# Patient Record
Sex: Male | Born: 1960 | Race: Black or African American | Hispanic: Yes | Smoking: Former smoker
Health system: Southern US, Community
[De-identification: ages and names within clinical notes are randomized; demographics above are authoritative.]

## PROBLEM LIST (undated history)

## (undated) DIAGNOSIS — R7303 Prediabetes: Secondary | ICD-10-CM

## (undated) DIAGNOSIS — R209 Unspecified disturbances of skin sensation: Secondary | ICD-10-CM

## (undated) DIAGNOSIS — M549 Dorsalgia, unspecified: Secondary | ICD-10-CM

## (undated) DIAGNOSIS — I959 Hypotension, unspecified: Secondary | ICD-10-CM

## (undated) HISTORY — DX: Unspecified disturbances of skin sensation: R20.9

## (undated) HISTORY — DX: Prediabetes: R73.03

## (undated) HISTORY — DX: Hypotension, unspecified: I95.9

## (undated) HISTORY — DX: Dorsalgia, unspecified: M54.9

## (undated) HISTORY — PX: OTHER SURGICAL HISTORY: SHX169

---

## 2019-01-27 ENCOUNTER — Ambulatory Visit (INDEPENDENT_AMBULATORY_CARE_PROVIDER_SITE_OTHER): Payer: Self-pay

## 2019-02-01 ENCOUNTER — Telehealth (INDEPENDENT_AMBULATORY_CARE_PROVIDER_SITE_OTHER): Payer: Self-pay

## 2019-02-01 LAB — CORONAVIRUS, COVID-19: SARS-CoV-2 RNA (COVID-19) Qualitative NAAT: DETECTED — CR

## 2019-02-01 NOTE — Telephone Encounter (Signed)
COVID-19 Test Results Notification Team    This Patient was notified by another team member. Called to verify patient have a scheduled follow-up.  Patient have questions regarding CDC guidelines. Reviewed the guidelines with him.       RN spoke with patient to provide POSITIVE COVID-19 test result.      After verifying patient name, address and DOB, patient was notified that test results are positive for COVID-19.    Patient was advised to follow the CDC Guidelines for "10 things you can do to manage your COVID-19 symptoms at home."  Reviewed the following CDC Guidelines with the patient:    . Stay home from work, school and public places.  . Get rest and stay hydrated.  . If you have an appointment, call the healthcare provider ahead of time and notify them that you have COVID-19.  . For medical emergencies, call 911 and notify dispatch that you have COVID-19.  . Coverage cough and sneezes  . Wash your hands often--20 seconds or with sanitizer that contains at least 60% alcohol.  . Stay in a specific room and away from other people in your home.  Wear a mask if you are around people.  . Avoid sharing personal items (dishes, towels, bedding).  . Clean all surfaces that are touched often.   . Monitor your symptoms and contact primary care   o Monitor emergency warning signs--persistent fever, trouble breathing, persistent pain or pressure in the chest, new confusion or inability to arouse, bluish lips or face, etc.   o Call 911 if having an emergency  . Do not discontinue home isolation until directed by primary care provider or health department  o General guidance:    - If symptoms are present:  . Self-isolate until at least 3 days (72 hours) have passed since recovery defined as resolution of fever without the use of fever-reducing medications and improvement in respiratory symptoms (e.g., cough, shortness of breath); and, at least 10 days have passed since symptoms first appeared OR  - If no symptoms are  present:  . Self-isolate until at least 10 days have passed since the date of your first positive COVID-19 test and have had no subsequent illness    Confirmed that patient's primary care physician is Dr Randell Loop at phone number N/A.  Routed Epic note to primary care physician.     Answered all patient questions.     Advised patient to contact their Primary Care Physician in the event that patient's symptoms change or worsen.      Reminded patient that the emergency room is always available in the event of an emergency.      Kimari Coudriet,BSN-RN-CDE  Diabetes Risk analyst.  COVID-19 Notification Team  Direct Line: [phone]  COVID-19 Test Results Call Center: [phone]

## 2019-02-01 NOTE — Telephone Encounter (Signed)
Patient called for results. Gave patient results.  Patient said he only has a sore throat and a strange taste in mouth. He is feeling fine.  Gave patient CDC guidelines. Told patient if he gets worse to follow up. He had no other questions

## 2019-02-10 ENCOUNTER — Telehealth (INDEPENDENT_AMBULATORY_CARE_PROVIDER_SITE_OTHER): Payer: Self-pay

## 2019-02-10 NOTE — Telephone Encounter (Signed)
COVID-19 Test Results Call Center    Pt wanted results email.  I emailed pt the results.            Drema Balzarine , MA  COVID-19 Notification Team  Direct Line:  [phone]  COVID-19 Test Results Call Center:  [phone]

## 2019-06-04 ENCOUNTER — Ambulatory Visit (INDEPENDENT_AMBULATORY_CARE_PROVIDER_SITE_OTHER): Payer: Medicaid Other | Admitting: Internal Medicine

## 2019-06-15 ENCOUNTER — Encounter (INDEPENDENT_AMBULATORY_CARE_PROVIDER_SITE_OTHER): Payer: Self-pay | Admitting: Internal Medicine

## 2019-06-15 ENCOUNTER — Ambulatory Visit (INDEPENDENT_AMBULATORY_CARE_PROVIDER_SITE_OTHER): Payer: Medicaid Other | Admitting: Internal Medicine

## 2019-06-15 VITALS — BP 121/75 | HR 52 | Temp 98.2°F | Ht 70.0 in | Wt 173.6 lb

## 2019-06-15 DIAGNOSIS — Z720 Tobacco use: Secondary | ICD-10-CM

## 2019-06-15 DIAGNOSIS — R0602 Shortness of breath: Secondary | ICD-10-CM | POA: Insufficient documentation

## 2019-06-15 DIAGNOSIS — Z8249 Family history of ischemic heart disease and other diseases of the circulatory system: Secondary | ICD-10-CM

## 2019-06-15 NOTE — Progress Notes (Signed)
[facility]  OFFICE CONSULTATION    Referring physician/provider:    Harvel Quale, MD    Chief Complaint:    Chief Complaint   Patient presents with   . Shortness of Breath     walking upstairs.        Problem List:  Patient Active Problem List   Diagnosis   . Family history of early CAD   . Tobacco abuse   . SOB (shortness of breath)       History of present illness:    Brendan Sanders is a 58 y.o. male who presents with FHx early CAD and tobacco abuse (not currently smoking but planning to start up again soon because he enjoys it), for further evaluation of SOB.    He was diagnosed with COVID19 in May and he had a mild case and he recovered quickly.    He is not exactly sure why he's here to see me but he has some SOB and tells me that his PCP had him do some pulmonary evaluation and he has a thing on his lung and COPD and has been seen by a pulmonary doctor. Everyone has basically just told him not to go back to smoking, but he plans to anyway. He thinks he was sent to make sure that his symptoms are not coming from his heart.    He reports that the SOB has been present since the COVID19 infection. It has been stable without any changes. He notices it while climbing up a flight of stairs. He denies any orthopnea, PND, or BLE edema. He denies any CP.    Past Medical History:   History reviewed. No pertinent past medical history.    Past Surgical History:  History reviewed. No pertinent surgical history.    Family History:   Family History   Problem Relation Age of Onset   . Diabetes Mother    . Myocardial Infarction Father 38       Social History:  Social History     Tobacco Use   . Smoking status: Former Smoker     Years: 25.00   . Smokeless tobacco: Never Used   Substance Use Topics   . Alcohol use: Never     Frequency: Never       Allergies:  No Known Allergies    Review of systems:  All other systems reviewed and are negative except as noted in HPI.    Medications:     Current Outpatient  Medications:   .  FLUoxetine (PROzac) 10 MG tablet, Take 10 mg by mouth daily, Disp: , Rfl:   .  ibuprofen (ADVIL) 600 MG tablet, Take 600 mg by mouth daily as needed  , Disp: , Rfl:   .  polyethylene glycol (MIRALAX) 17 g packet, Take 17 g by mouth daily, Disp: , Rfl:   .  traZODone (DESYREL) 100 MG tablet, Take 100 mg by mouth nightly, Disp: , Rfl:     Physical examination:   BP 121/75 (BP Site: Left arm, Patient Position: Sitting, Cuff Size: Medium)   Pulse (!) 52   Temp 98.2 F (36.8 C) (Oral)   Ht 1.778 m (5\' 10" )   Wt 78.7 kg (173 lb 9.6 oz)   SpO2 98%   BMI 24.91 kg/m     General:  Alert and oriented x3, no acute distress   Head:  Normocephalic, atraumatic, moist mucus membranes, oropharynx clear, sclera anicteric   Neck: Supple, no carotid bruits, no JVD  Lungs:   Clear to auscultation bilaterally, respirations unlabored   Heart:  Regular rate, normal S1/S2, no murmurs, rubs or gallops noted   Abdomen:   Soft, non-tender, non-distended   Extremities: Normal strength and tone, no ulcers, no cyanosis, no edema   Skin: No obvious rashes   Neurologic: Motor function grossly intact       Electrocardiogram:  NSR with early repolarization    Pertinent Problems/Issues:  Patient Active Problem List    Diagnosis Date Noted   . Family history of early CAD 06/15/2019   . Tobacco abuse 06/15/2019   . SOB (shortness of breath) 06/15/2019         Recommendations/plan:   - His symptoms are most likely due to a combination of smoking and his COVID infection. But he is worried about his family history of early CAD and I can't completely exclude a cardiac etiology given his risk factors and his COVID exposure.     - I will have him check an echo to ensure he has a structurally normal heart     - I will also have him obtain an ETT to rule out ischemia     - I strongly discouraged him from restarting smoking, but he assures me he will     - F/u for results      Thank you for involving me in the care of this pleasant  patient.  If you have any further questions or concerns please do not hesitate to contact me.    Sincerely,  Rosalin Hawking MD [facility]  [address] [place] [address]  Office: [phone]  Fax: [phone]  Pager: ID# [phone]

## 2019-06-17 ENCOUNTER — Encounter (INDEPENDENT_AMBULATORY_CARE_PROVIDER_SITE_OTHER): Payer: Self-pay | Admitting: Urology

## 2019-06-17 ENCOUNTER — Telehealth (INDEPENDENT_AMBULATORY_CARE_PROVIDER_SITE_OTHER): Payer: Medicaid Other | Admitting: Urology

## 2019-06-17 DIAGNOSIS — R351 Nocturia: Secondary | ICD-10-CM

## 2019-06-17 DIAGNOSIS — Z3009 Encounter for other general counseling and advice on contraception: Secondary | ICD-10-CM

## 2019-06-17 DIAGNOSIS — R972 Elevated prostate specific antigen [PSA]: Secondary | ICD-10-CM

## 2019-06-17 DIAGNOSIS — N5089 Other specified disorders of the male genital organs: Secondary | ICD-10-CM

## 2019-06-17 NOTE — Progress Notes (Signed)
Subjective:      Patient ID: Brendan Sanders is a 58 y.o. male     Chief Complaint:    Has two children- 54 and 52 yo. girlfriend is 53. - he has questions about having a vasectomy. His wife has not had a period in awhile- does not know exactly when she stopped. This is his sole sexual partner.    PSA was 4.2- in mar 2020, has not been repeated. No previous psa values he was aware of. He was not aware that it was even elevated.    Nocturia 3-4 at night, has good force of stream, HBG A1c marginally elevated in mar 2020 at 5.9.    Had a scrotal ultrasound done at [facility] in may 2020- for a scrotal mass, does not know the result. He may have been seeing another urologist, he does not remember name.    This visit was a synchonous telemedicine service rendered via a real time interactive audio and video. Verbal Consent has been obtained from the patient to conduct a telemedicine visit to minimize exposure to COVID-19    The following portions of the patient's history were reviewed and updated as appropriate: allergies, current medications, past family history, past medical history, past social history, past surgical history and problem list.    Review of Systems   Systems reviewed per the HPI and below:     History obtained from the patient     General ROS: Pt otherwise feeling well, no recent illness.       Ophthalmic ROS: negative for blurry vision or yellowing of the eyes     Allergy and Immunology ROS: known/unknown allergies as described by the patient     Hematological and Lymphatic ROS: No known bleeding/clotting disorders     Endocrine ROS: no significant hot/cold spells     Respiratory ROS: no cough, shortness of breath, or wheezing     Cardiovascular ROS: no chest pain or dyspnea on exertion     Gastrointestinal ROS: no abdominal pain, change in bowel habits     Genito-Urinary ROS: see hpi     Musculoskeletal ROS:  no swelling to lower extremities, no back pain     Neurological ROS: no focal  weakness     Dermatological ROS: no new rashes or lesions       Objective:   There were no vitals taken for this visit.  General appearance - alert, well appearing, and in no distress  Head: Normocephalic and atraumatic  Neuro - alert, oriented to person, place, and time, appropriate affect  Skin: Normal temperature, turgor and texture; no rash or ulcers  Eyes - Conjunctiva/corneas clear, EOM's intact  Neck - Supple, symmetrical, trachea midline, NROM   Chest - No labored breathing      Lab Review   Results     ** No results found for the last 24 hours. **            Assessment:     1. Elevated prostate specific antigen (PSA)  PSA, total and free    Urinalysis with microscopic   2. Nocturia  Hemoglobin A1C    Urinalysis with microscopic   3. Vasectomy evaluation     4. Scrotal mass           Plan:   Patient Instructions   In regards to his vasectomy question the fact that he has a monogamous partner that is 38 years old and no longer having menstrual cycles the chance of her  getting pregnant is very very small.  If it has been over 2 years since her last menstrual.  Then there is no need for him to have a vasectomy.    His PSA that was drawn in March was marginally elevated at 4.2 I recommend checking another PSA and this is been entered into the [place] system.    For his nocturia he had a marginally elevated hemoglobin A1c at 5.9 in March 2025 ordered another hemoglobin A1c as well as a urinalysis that he will also complete in the [facility] lab.    We will contact sentara to get the result of his recent scrotal ultrasound.    I have instructed him to make another follow-up video appointment with me in 3 to 4 weeks to discuss the above labs.    The patient and I discussed PSA and its use as a screening tool for prostate cancer. We discussed that PSA was initially designed to be a tumor marker, however in the early 1990's it was used to screen men for prostate cancer. We discussed that no screening tool is perfect. We  also discussed there is no PSA value that I can guarantee that he does or does not have prostate cancer. We discussed that men with big prostates, recent infection or prostate manipulation with a catheter or other instrument can make the PSA increase.   We discussed the AUA recommendations on prostate cancer screening with DRE as well as with PSA. Screening must take into account the overall health of the patient as well as race and family history and the rate of change of PSA. We also discussed the European randomized prostate cancer screening study showing a decreased chance of dying of prostate cancer if you were in the screening arm. We also discussed the harm of screening being finding a cancer that may not cause the patient harm and the morbidities of the treatment for prostate cancer as well as the risk of infection and bleeding from a prostate biopsy, which is the only way to determine if there is prostate cancer.    AUA GUIDELINES ON PSA SCREENING   PSA screening in men under age 68 years is not recommended.   Routine screening in men between ages 36 to 78 years at average risk is not recommended.   For men ages 78 to 19 years, the decision to undergo PSA screening involves weighing the benefits of preventing prostate cancer mortality in 1 man for every 781 men screened over a decade against the known potential harms associated with screening and treatment. For this reason, shared decision-making is recommended for men age 16 to 63 years that are considering PSA screening, and proceeding based on patients' values and preferences.   To reduce the harms of screening, a routine screening interval of two years or more may be preferred over annual screening in those men who have participated in shared decision-making and decided on screening. As compared to annual screening, it is expected that screening intervals of two years preserve the majority of the benefits and reduce over diagnosis and false  positives.  Routine PSA screening is not recommended in men over age 60 or any man with less than a 10-15 year life expectancy.        Orders  Orders Placed This Encounter   Procedures   . PSA, total and free     Standing Status:   Future     Standing Expiration Date:   06/16/2020   . Hemoglobin A1C  Standing Status:   Future     Standing Expiration Date:   06/16/2020   . Urinalysis with microscopic     Standing Status:   Future     Standing Expiration Date:   06/16/2020     Order Specific Question:   URINE TYPE     Answer:   Clean Catch

## 2019-06-17 NOTE — Patient Instructions (Addendum)
In regards to his vasectomy question the fact that he has a monogamous partner that is 58 years old and no longer having menstrual cycles the chance of her getting pregnant is very very small.  If it has been over 2 years since her last menstrual.  Then there is no need for him to have a vasectomy.    His PSA that was drawn in March was marginally elevated at 4.2 I recommend checking another PSA and this is been entered into the [facility] system.    For his nocturia he had a marginally elevated hemoglobin A1c at 5.9 in March 2025 ordered another hemoglobin A1c as well as a urinalysis that he will also complete in the [facility] lab.    We will contact sentara to get the result of his recent scrotal ultrasound.    I have instructed him to make another follow-up video appointment with me in 3 to 4 weeks to discuss the above labs.    The patient and I discussed PSA and its use as a screening tool for prostate cancer. We discussed that PSA was initially designed to be a tumor marker, however in the early 1990's it was used to screen men for prostate cancer. We discussed that no screening tool is perfect. We also discussed there is no PSA value that I can guarantee that he does or does not have prostate cancer. We discussed that men with big prostates, recent infection or prostate manipulation with a catheter or other instrument can make the PSA increase.   We discussed the AUA recommendations on prostate cancer screening with DRE as well as with PSA. Screening must take into account the overall health of the patient as well as race and family history and the rate of change of PSA. We also discussed the European randomized prostate cancer screening study showing a decreased chance of dying of prostate cancer if you were in the screening arm. We also discussed the harm of screening being finding a cancer that may not cause the patient harm and the morbidities of the treatment for prostate cancer as well as the risk of infection  and bleeding from a prostate biopsy, which is the only way to determine if there is prostate cancer.    AUA GUIDELINES ON PSA SCREENING   PSA screening in men under age 58 years is not recommended.   Routine screening in men between ages 44 to 26 years at average risk is not recommended.   For men ages 80 to 64 years, the decision to undergo PSA screening involves weighing the benefits of preventing prostate cancer mortality in 1 man for every 781 men screened over a decade against the known potential harms associated with screening and treatment. For this reason, shared decision-making is recommended for men age 68 to 27 years that are considering PSA screening, and proceeding based on patients' values and preferences.   To reduce the harms of screening, a routine screening interval of two years or more may be preferred over annual screening in those men who have participated in shared decision-making and decided on screening. As compared to annual screening, it is expected that screening intervals of two years preserve the majority of the benefits and reduce over diagnosis and false positives.  Routine PSA screening is not recommended in men over age 73 or any man with less than a 10-15 year life expectancy.

## 2019-06-18 ENCOUNTER — Other Ambulatory Visit (FREE_STANDING_LABORATORY_FACILITY): Payer: Medicaid Other

## 2019-06-18 DIAGNOSIS — R351 Nocturia: Secondary | ICD-10-CM

## 2019-06-18 DIAGNOSIS — R430 Anosmia: Secondary | ICD-10-CM

## 2019-06-18 DIAGNOSIS — R972 Elevated prostate specific antigen [PSA]: Secondary | ICD-10-CM

## 2019-06-18 LAB — URINALYSIS WITH MICROSCOPIC
Bilirubin, UA: NEGATIVE
Blood, UA: NEGATIVE
Glucose, UA: NEGATIVE
Ketones UA: NEGATIVE
Leukocyte Esterase, UA: NEGATIVE
Nitrite, UA: NEGATIVE
Protein, UR: NEGATIVE
Specific Gravity UA: 1.025 (ref 1.001–1.035)
Urine pH: 7 (ref 5.0–8.0)
Urobilinogen, UA: 0.2 mg/dL (ref 0.2–2.0)

## 2019-06-18 LAB — CBC AND DIFFERENTIAL
Absolute NRBC: 0 10*3/uL (ref 0.00–0.00)
Basophils Absolute Automated: 0.01 10*3/uL (ref 0.00–0.08)
Basophils Automated: 0.3 %
Eosinophils Absolute Automated: 0.03 10*3/uL (ref 0.00–0.44)
Eosinophils Automated: 1 %
Hematocrit: 46.5 % (ref 37.6–49.6)
Hgb: 14.8 g/dL (ref 12.5–17.1)
Immature Granulocytes Absolute: 0 10*3/uL (ref 0.00–0.07)
Immature Granulocytes: 0 %
Lymphocytes Absolute Automated: 1.71 10*3/uL (ref 0.42–3.22)
Lymphocytes Automated: 54.8 %
MCH: 30.4 pg (ref 25.1–33.5)
MCHC: 31.8 g/dL (ref 31.5–35.8)
MCV: 95.5 fL (ref 78.0–96.0)
MPV: 12.8 fL — ABNORMAL HIGH (ref 8.9–12.5)
Monocytes Absolute Automated: 0.2 10*3/uL — ABNORMAL LOW (ref 0.21–0.85)
Monocytes: 6.4 %
Neutrophils Absolute: 1.17 10*3/uL (ref 1.10–6.33)
Neutrophils: 37.5 %
Nucleated RBC: 0 /100 WBC (ref 0.0–0.0)
Platelets: 92 10*3/uL — ABNORMAL LOW (ref 142–346)
RBC: 4.87 10*6/uL (ref 4.20–5.90)
RDW: 14 % (ref 11–15)
WBC: 3.12 10*3/uL (ref 3.10–9.50)

## 2019-06-18 LAB — HEMOGLOBIN A1C
Average Estimated Glucose: 119.8 mg/dL
Hemoglobin A1C: 5.8 % (ref 4.6–5.9)

## 2019-06-18 LAB — PSA, TOTAL AND FREE
PSA Free and Total Ratio: 0.114
PSA, Free: 0.516 ng/mL — ABNORMAL HIGH (ref 0.000–0.500)
Prostate Specific Antigen, Total: 4.508 ng/mL — ABNORMAL HIGH (ref 0.000–4.000)

## 2019-06-18 LAB — RHEUMATOID FACTOR: Rheumatoid Factor: <15 (ref 0.0–30.0)

## 2019-06-20 ENCOUNTER — Other Ambulatory Visit (INDEPENDENT_AMBULATORY_CARE_PROVIDER_SITE_OTHER): Payer: Self-pay | Admitting: Urology

## 2019-06-20 ENCOUNTER — Encounter (INDEPENDENT_AMBULATORY_CARE_PROVIDER_SITE_OTHER): Payer: Self-pay | Admitting: Urology

## 2019-06-20 DIAGNOSIS — R972 Elevated prostate specific antigen [PSA]: Secondary | ICD-10-CM

## 2019-06-20 NOTE — Progress Notes (Signed)
psa at 4.5  Will get mri, never had biopsy

## 2019-06-21 LAB — SJOGRENS SYNDROME-B EXTRACTABLE NUCLEAR ANTIBODY
Sjogrens SSB (La) Antibody Interpretation: NEGATIVE
Sjogrens SSB (La) Antibody: 14 (ref 0–99)

## 2019-06-21 LAB — SJOGRENS SYNDROME-A EXTRACTABLE NUCLEAR ANTIBODY(SOFT)
SSA Interpretation: NEGATIVE
Sjogrens SSA (Ro) Antibody: 7 (ref 0–99)

## 2019-06-21 LAB — ANTI-NEUTROPHILIC CYTOPLASMIC ANTIBODY
c-ANCA: NEGATIVE
p-ANCA: NEGATIVE

## 2019-06-21 LAB — ANA SCREEN ONLY: ANA Screen: NEGATIVE

## 2019-06-25 ENCOUNTER — Ambulatory Visit (INDEPENDENT_AMBULATORY_CARE_PROVIDER_SITE_OTHER): Payer: Medicaid Other

## 2019-06-25 DIAGNOSIS — Z720 Tobacco use: Secondary | ICD-10-CM

## 2019-06-25 DIAGNOSIS — Z8249 Family history of ischemic heart disease and other diseases of the circulatory system: Secondary | ICD-10-CM

## 2019-06-25 DIAGNOSIS — R0602 Shortness of breath: Secondary | ICD-10-CM

## 2019-06-26 ENCOUNTER — Encounter (INDEPENDENT_AMBULATORY_CARE_PROVIDER_SITE_OTHER): Payer: Self-pay | Admitting: Urology

## 2019-06-28 ENCOUNTER — Ambulatory Visit (INDEPENDENT_AMBULATORY_CARE_PROVIDER_SITE_OTHER): Payer: Medicaid Other | Admitting: Internal Medicine

## 2019-06-28 ENCOUNTER — Ambulatory Visit
Admission: RE | Admit: 2019-06-28 | Discharge: 2019-06-28 | Disposition: A | Discharge status: Home / Self Care | Payer: Medicaid Other | Source: Ambulatory Visit | Attending: Internal Medicine | Admitting: Internal Medicine

## 2019-06-28 DIAGNOSIS — Z720 Tobacco use: Secondary | ICD-10-CM

## 2019-06-28 DIAGNOSIS — R0602 Shortness of breath: Secondary | ICD-10-CM | POA: Insufficient documentation

## 2019-06-28 DIAGNOSIS — Z8249 Family history of ischemic heart disease and other diseases of the circulatory system: Secondary | ICD-10-CM | POA: Insufficient documentation

## 2019-06-28 LAB — ECG 12-LEAD
Atrial Rate: 52 {beats}/min
P Axis: 76 degrees
P-R Interval: 184 ms
Q-T Interval: 396 ms
QRS Duration: 72 ms
QTC Calculation (Bezet): 368 ms
R Axis: 76 degrees
T Axis: 71 degrees
Ventricular Rate: 52 {beats}/min

## 2019-06-30 ENCOUNTER — Telehealth (INDEPENDENT_AMBULATORY_CARE_PROVIDER_SITE_OTHER): Payer: Self-pay

## 2019-06-30 ENCOUNTER — Ambulatory Visit
Admission: RE | Admit: 2019-06-30 | Discharge: 2019-06-30 | Disposition: A | Discharge status: Home / Self Care | Payer: Medicaid Other | Source: Ambulatory Visit | Attending: Urology | Admitting: Urology

## 2019-06-30 DIAGNOSIS — R972 Elevated prostate specific antigen [PSA]: Secondary | ICD-10-CM | POA: Insufficient documentation

## 2019-06-30 DIAGNOSIS — N400 Enlarged prostate without lower urinary tract symptoms: Secondary | ICD-10-CM | POA: Insufficient documentation

## 2019-06-30 DIAGNOSIS — N4289 Other specified disorders of prostate: Secondary | ICD-10-CM | POA: Insufficient documentation

## 2019-06-30 MED ORDER — GADOBUTROL 1 MMOL/ML IV SOLN
10.00 mL | Freq: Once | INTRAVENOUS | Status: AC | PRN
Start: 2019-06-30 — End: 2019-06-30
  Administered 2019-06-30: 10 mmol via INTRAVENOUS
  Filled 2019-06-30: qty 10

## 2019-06-30 NOTE — Telephone Encounter (Signed)
-----   Message from Rosalin Hawking, MD sent at 06/30/2019 10:58 AM EDT -----  Looks good, no significant abnormalities.  -nick  ----- Message -----  From: Interface, Rad Results In  Sent: 06/30/2019  10:09 AM EDT  To: Rosalin Hawking, MD

## 2019-06-30 NOTE — Telephone Encounter (Signed)
Notified the patient of results. The patient verbalized understanding.

## 2019-07-01 ENCOUNTER — Encounter (INDEPENDENT_AMBULATORY_CARE_PROVIDER_SITE_OTHER): Payer: Self-pay

## 2019-07-06 ENCOUNTER — Encounter (INDEPENDENT_AMBULATORY_CARE_PROVIDER_SITE_OTHER): Payer: Self-pay | Admitting: Urology

## 2019-07-14 ENCOUNTER — Telehealth (INDEPENDENT_AMBULATORY_CARE_PROVIDER_SITE_OTHER): Payer: Medicaid Other | Admitting: Urology

## 2019-07-14 ENCOUNTER — Encounter (INDEPENDENT_AMBULATORY_CARE_PROVIDER_SITE_OTHER): Payer: Self-pay | Admitting: Urology

## 2019-07-14 DIAGNOSIS — R972 Elevated prostate specific antigen [PSA]: Secondary | ICD-10-CM

## 2019-07-14 MED ORDER — LEVOFLOXACIN 500 MG PO TABS
ORAL_TABLET | ORAL | 0 refills | Status: AC
Start: 2019-07-14 — End: 2020-07-13

## 2019-07-14 NOTE — Progress Notes (Signed)
Subjective:      Patient ID: Brendan Sanders is a 57 y.o. male     Chief Complaint:  PSA was 4.2- in mar 2020, has not been repeated. No previous psa values he was aware of. He was not aware that it was even elevated.    Recheck of psa in oct 2020 was 4.5 with 11% free.    Nocturia 3-4 at night, has good force of stream, HBG A1c marginally elevated in mar 2020 at 5.9.    MRI PROSTATE WITHOUT AND WITH CONTRAST    HISTORY: Elevated PSA    TECHNIQUE: The prostate was imaged without endorectal coil on a 3 Tesla  magnet utilizing a high-resolution body matrix coil. Sequences include  large field of view of pelvis, small field of view axial sagittal and  coronal T2,  3-D Cube, axial T1-weighted imaging precontrast and dynamic  contrast-enhanced imaging post injection of 10 cc Gadavist IV contrast, and  diffusion-weighted imaging performed with apparent diffusion coefficient  mapping.    FINDINGS:    Hemorrhage: None    Prostate volume: 4.1 x 3.4 x 4.5 cm (TR X AP X CC), volume 32.6 cc.    Peripheral zone: Diffusely heterogeneous with loss of normal T2 signal. A  more focal area of T2 hypointensity with restricted diffusion is identified  within the right peripheral zone at the level of the mid gland.    Lesion 1:   Side: Right  Level: Mid gland  Zone: Peripheral  Location:Anterior medial  Sector map: PZa  Size: 1.2 cm    Relation to capsule: Abuts the capsule without capsular bulging or  extracapsular extension  Series: 6, image 23  ADC value: 774    Assessment categories:  T2= 4  DWI-ADC=4  DCE: +  PI-RADS=4    Transition zone: Very mild BPH with no suspicious lesions.    Seminal vesicles: Fluid-filled. Within normal limits    Neurovascular bundles: Within normal limits    Bladder neck: Within normal limits.    Membranous urethra: Normal    Lymph nodes: No enlarged lymph nodes    Bone marrow: No discrete osseous lesions can be visualized.    IMPRESSION:      Focal suspicious abnormality in the right  peripheral zone at the level of  the mid gland anteriorly. Very mild BPH.    PI-RADS Category 4    Overall assessment categories (PI-RADS V2):      This visit was a synchonous telemedicine service rendered via a real time interactive audio. Verbal Consent has been obtained from the patient to conduct a telemedicine visit to minimize exposure to COVID-19    The following portions of the patient's history were reviewed and updated as appropriate: allergies, current medications, past family history, past medical history, past social history, past surgical history and problem list.    Review of Systems   General ROS: Pt otherwise feeling well, no recent illness no fevers, no chills   Gastrointestinal ROS: no abdominal pain, change in bowel habits   Genito-Urinary ROS: see hpi   Musculoskeletal ROS:  no swelling to lower extremities, no back pain         Objective:   There were no vitals taken for this visit.    22 minutes was spent with the patientover the phone discussing his disease process and various treatment options and the side effects of those options.  Over 50% of this time was direct patient counseling.        Lab  Review   Results     ** No results found for the last 24 hours. **            Assessment:     1. Elevated prostate specific antigen (PSA)  US Guided Biopsy Prostate         Plan:   Patient Instructions   Will schedule fusion biopsy at [facility] ultrasound dept.  levoquin 500 mg daily x3 days, start the day before the biopsy.  Hold anitcoagulation before the biopsy. Check with your primary care doc to ensure this is ok.  Fleets enema the night before and the morning of the biopsy.  Risks of infection and bleeding were discussed.  You may call the ultrasound dept at [phone] if you have not heard from them in a few days.      Prostate Biopsy    Cancer occurs when abnormal cells form a tumor. A tumor is a lump of cells that grows uncontrolled. Early tests that may indicate cancer of the  prostate include a digital rectal exam, a PSA (prostate specific antigen blood test), ultrasound, and others. A core needle biopsy will be done if your healthcare provider thinks you have prostate cancer. A thin needle is used to remove small samples of prostate tissue. Often a few biopsies are taken. These samples are checked for cancer.  Taking tissue samples  A biopsy takes about 15 to 20 minutes. You may be given an enema or suppository before the biopsy to clear the bowels. Antibiotics are given at least an hour before the biopsy. During the procedure:   You will be given antibiotics to prevent infection.   You may be given a sedative, local pain killer, or pain medicine.   A small ultrasound probe is put into the rectum as you lie on your side. A picture of your prostate can then be seen on a screen. This is called a transrectal ultrasound (TRUS).   Your healthcare provider will use the TRUS picture as a guide. He or she will use a thin needle to remove tiny tissue samples from some sites in the prostate.   These tissue samples are sent to the pathology department. They are looked at under a microscope so a diagnosis can be made.   Risks and possible complications of core needle biopsy   Infection   Blood in urine, stool, or semen   Pain   The biopsy misses the tumor    Home care  You may have had the biopsy done through your rectum, your urethra, or through the skin between your scrotum and rectum. Your healthcare provider will tell you what to do after the biopsy. These instructions are based on your health condition, the type of biopsy, and your provider's practices.   Your provider may give you pain medicine such as acetaminophen or ibuprofen for discomfort or pain. Follow your provider's instructions for taking these medicines. Don't take aspirin or ibuprofen after the procedure. If you have an ongoing (chronic) liver or kidney disease, talk with your provider before taking these medicines. Also  talk with your provider if you've had a stomach ulcer or GI (gastrointestinal) bleeding. Let your provider know all the medicines you currently take.   You may need to take antibiotics for 1 to 2 days. This will help prevent an infection. Signs of an infection include chills, pain, or fever.   You may be told to drink 8 ounces of water every 30 minutes for 2 hours. This  will help ease any discomfort. You can also take a warm bath. Or you can put a warm, damp washcloth over your urethra to help ease the pain.   You may see minor bleeding after the procedure. This is normal and often needs no treatment. You may see blood in your urine or semen (rust color). This may last for 1 to 2 months. You may also have light bleeding from your rectum if you have hemorrhoids. This can last up to 7- to 14 days.   When you urinate you may go more often, feel burning, and see pink-tinged urine for up to 7 to 14 days after the biopsy.   Your provider will tell you when you can go back to your normal activities. This includes sex, exercise, and straining physically. Discuss these with your provider.  When to seek medical advice  Call your healthcare provider right away if any of these occur:   You aren't able to urinate, with or without your bladder feeling full, or see that you have less flow of urine   Chills   Fever of 100.9F (38C) or higher, or as directed by your provider   Severe pain   Signs of an infection that is getting worse. These include worsening pain, pain in your side under the rib cage or in the low back, or bad-smelling urine.   Blood clots or bright red blood in your stool or urine   You feel confused or very tired   Your lower belly feels firm over your bladder area  [employer] last reviewed this educational content on 02/07/2018   2000-2020 [employer], [place]. [address], [place], [place] [address]. All rights reserved. This information is not intended as a substitute for professional medical  care. Always follow your healthcare professional's instructions.          Orders  Orders Placed This Encounter   Procedures   . US Guided Biopsy Prostate     Standing Status:   Future     Standing Expiration Date:   07/13/2020     Order Specific Question:   Reason for Exam:     Answer:   ele psa and mri with lesions that are suspcious for prostate cancer     Comments:   with dr Walker Shadow

## 2019-07-14 NOTE — Patient Instructions (Signed)
Will schedule fusion biopsy at [facility] ultrasound dept.  levoquin 500 mg daily x3 days, start the day before the biopsy.  Hold anitcoagulation before the biopsy. Check with your primary care doc to ensure this is ok.  Fleets enema the night before and the morning of the biopsy.  Risks of infection and bleeding were discussed.  You may call the ultrasound dept at [phone] if you have not heard from them in a few days.      Prostate Biopsy    Cancer occurs when abnormal cells form a tumor. A tumor is a lump of cells that grows uncontrolled. Early tests that may indicate cancer of the prostate include a digital rectal exam, a PSA (prostate specific antigen blood test), ultrasound, and others. A core needle biopsy will be done if your healthcare provider thinks you have prostate cancer. A thin needle is used to remove small samples of prostate tissue. Often a few biopsies are taken. These samples are checked for cancer.  Taking tissue samples  A biopsy takes about 15 to 20 minutes. You may be given an enema or suppository before the biopsy to clear the bowels. Antibiotics are given at least an hour before the biopsy. During the procedure:   You will be given antibiotics to prevent infection.   You may be given a sedative, local pain killer, or pain medicine.   A small ultrasound probe is put into the rectum as you lie on your side. A picture of your prostate can then be seen on a screen. This is called a transrectal ultrasound (TRUS).   Your healthcare provider will use the TRUS picture as a guide. He or she will use a thin needle to remove tiny tissue samples from some sites in the prostate.   These tissue samples are sent to the pathology department. They are looked at under a microscope so a diagnosis can be made.   Risks and possible complications of core needle biopsy   Infection   Blood in urine, stool, or semen   Pain   The biopsy misses the tumor    Home care  You may have had the biopsy  done through your rectum, your urethra, or through the skin between your scrotum and rectum. Your healthcare provider will tell you what to do after the biopsy. These instructions are based on your health condition, the type of biopsy, and your provider's practices.   Your provider may give you pain medicine such as acetaminophen or ibuprofen for discomfort or pain. Follow your provider's instructions for taking these medicines. Don't take aspirin or ibuprofen after the procedure. If you have an ongoing (chronic) liver or kidney disease, talk with your provider before taking these medicines. Also talk with your provider if you've had a stomach ulcer or GI (gastrointestinal) bleeding. Let your provider know all the medicines you currently take.   You may need to take antibiotics for 1 to 2 days. This will help prevent an infection. Signs of an infection include chills, pain, or fever.   You may be told to drink 8 ounces of water every 30 minutes for 2 hours. This will help ease any discomfort. You can also take a warm bath. Or you can put a warm, damp washcloth over your urethra to help ease the pain.   You may see minor bleeding after the procedure. This is normal and often needs no treatment. You may see blood in your urine or semen (rust color). This may last for   1 to 2 months. You may also have light bleeding from your rectum if you have hemorrhoids. This can last up to 7- to 14 days.   When you urinate you may go more often, feel burning, and see pink-tinged urine for up to 7 to 14 days after the biopsy.   Your provider will tell you when you can go back to your normal activities. This includes sex, exercise, and straining physically. Discuss these with your provider.  When to seek medical advice  Call your healthcare provider right away if any of these occur:   You aren't able to urinate, with or without your bladder feeling full, or see that you have less flow of urine   Chills   Fever of 100.4F  (38C) or higher, or as directed by your provider   Severe pain   Signs of an infection that is getting worse. These include worsening pain, pain in your side under the rib cage or in the low back, or bad-smelling urine.   Blood clots or bright red blood in your stool or urine   You feel confused or very tired   Your lower belly feels firm over your bladder area  [employer] last reviewed this educational content on 02/07/2018   2000-2020 [employer] [address], [place] [address]. All rights reserved. This information is not intended as a substitute for professional medical care. Always follow your healthcare professional's instructions.

## 2019-07-30 ENCOUNTER — Encounter (INDEPENDENT_AMBULATORY_CARE_PROVIDER_SITE_OTHER): Payer: Self-pay

## 2022-11-19 IMAGING — MR MRI LUMBAR SPINE WITHOUT CONTRAST
4 of 6 series · 31 of 48 positions shown · IV contrast (gadolinium)
Comparison: No prior imaging studies of lumbar spine are available for comparison.

﻿EXAM:  [id number]   MRI LUMBAR SPINE WITHOUT CONTRAST
INDICATION: 61-year-old male diagnosed with prostate cancer in 0502.  Persistent low back pain. Radicular symptoms to left lower extremity with weakness for 1 month.  Latest PSA level not available.
TECHNIQUE: Multiplanar, multisequential MRI of the lumbosacral spine was performed without gadolinium contrast.

[Series 11: T2 · sagittal · 4.0mm · 0.94mm/px · 6 of 13 slices shown (1 of 3)]
[im 1/13]
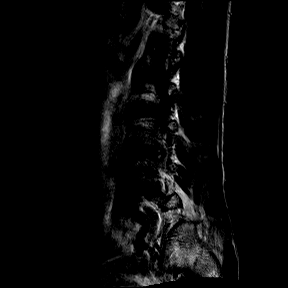
[im 3/13]
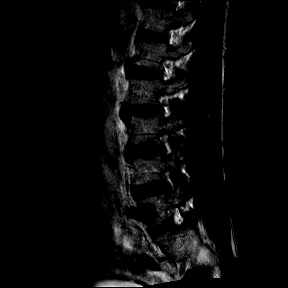
[im 5/13]
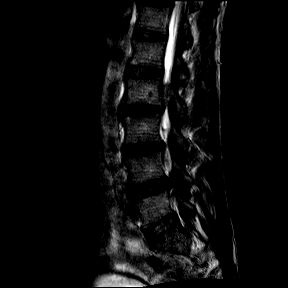
[im 8/13]
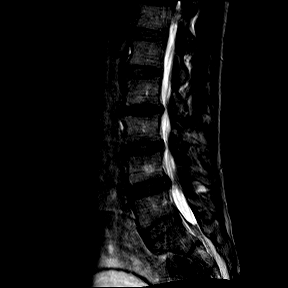
[im 10/13]
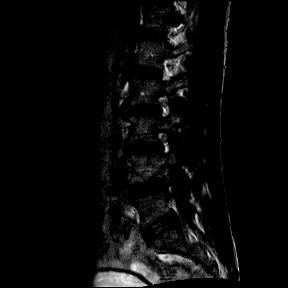
[im 13/13]
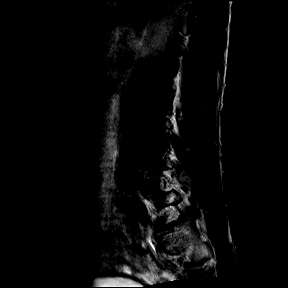

[Series 12: T1 · sagittal · 4.0mm · 0.94mm/px · 6 of 13 slices shown]
[im 1/13]
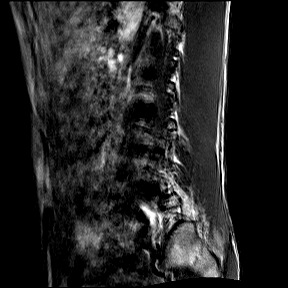
[im 3/13]
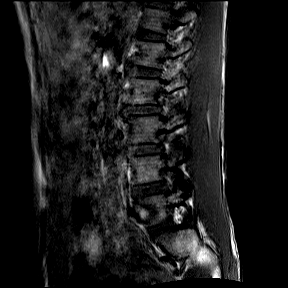
[im 5/13]
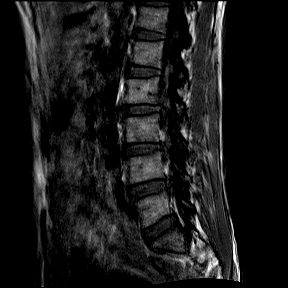
[im 8/13]
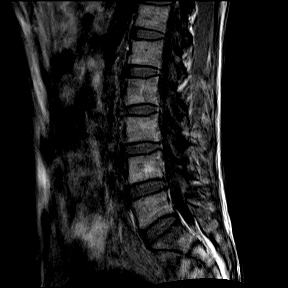
[im 10/13]
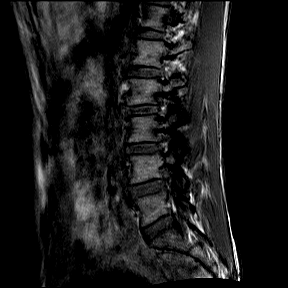
[im 13/13]
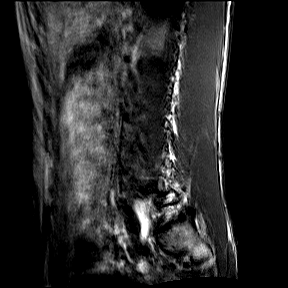

[Series 14: T2 · axial · 4.0mm · 0.52mm/px · z∈[-174,+32]mm · 11 of 23 slices shown (2 of 3)]
[im 1/23]
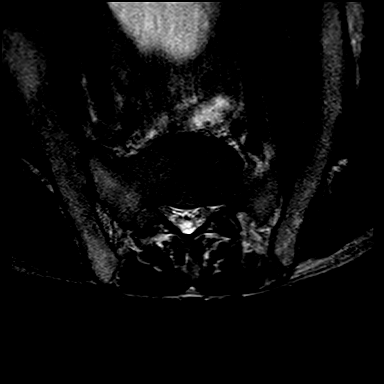
[im 3/23]
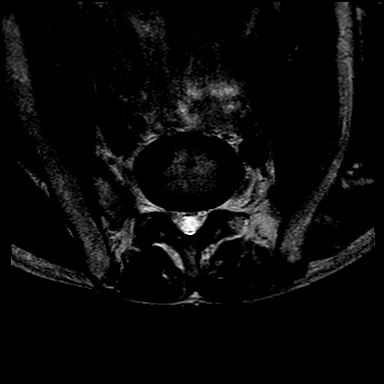
[im 5/23]
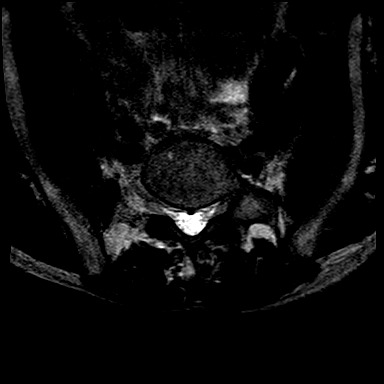
[im 7/23]
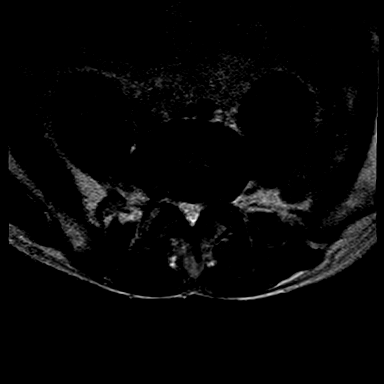
[im 9/23]
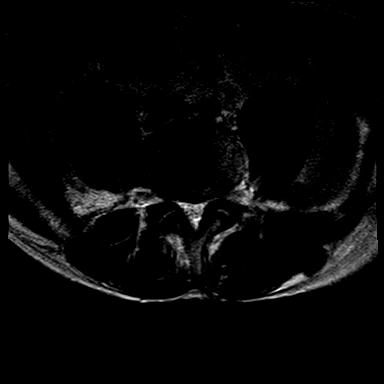
[im 12/23]
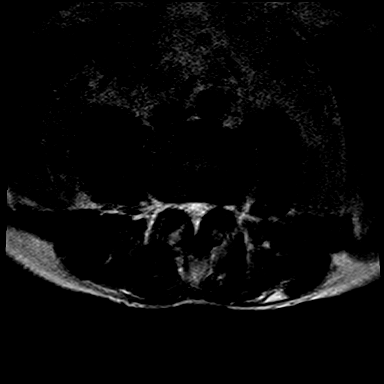
[im 14/23]
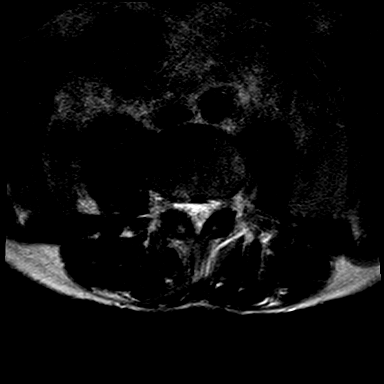
[im 16/23]
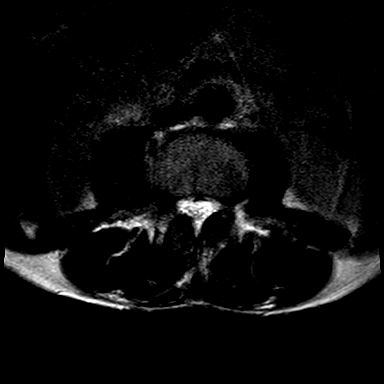
[im 18/23]
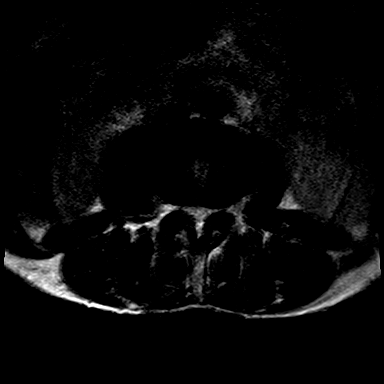
[im 20/23]
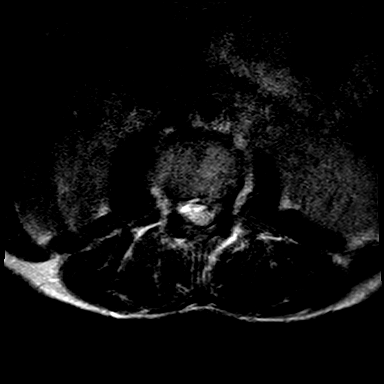
[im 23/23]
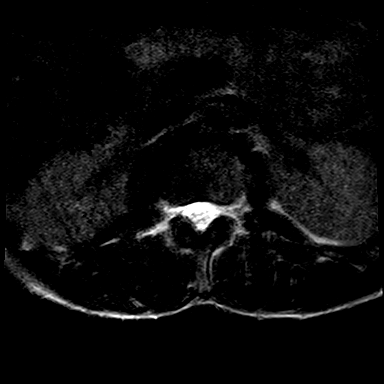

[Series 16: T2 · coronal · 5.0mm · 0.82mm/px · 8 of 17 slices shown (3 of 3)]
[im 1/17]
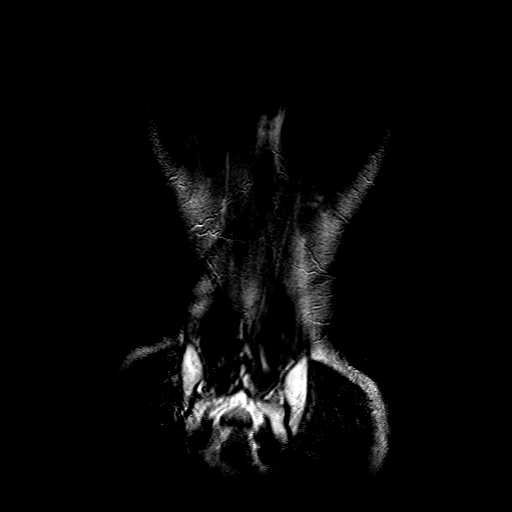
[im 3/17]
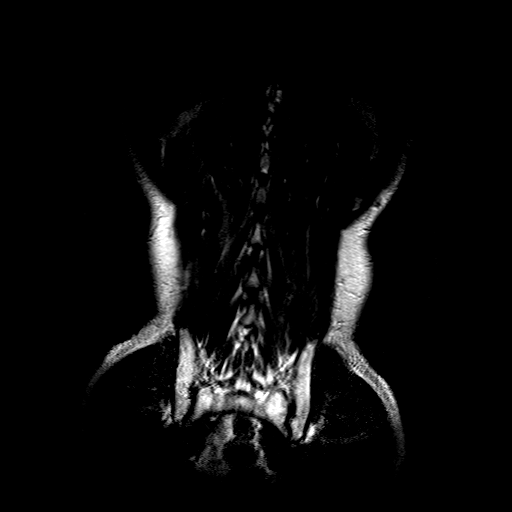
[im 5/17]
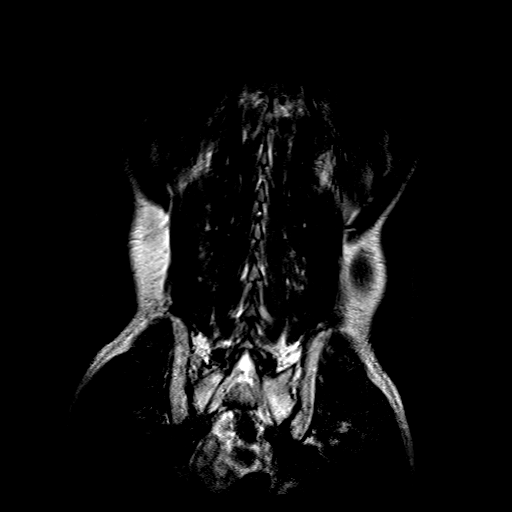
[im 7/17]
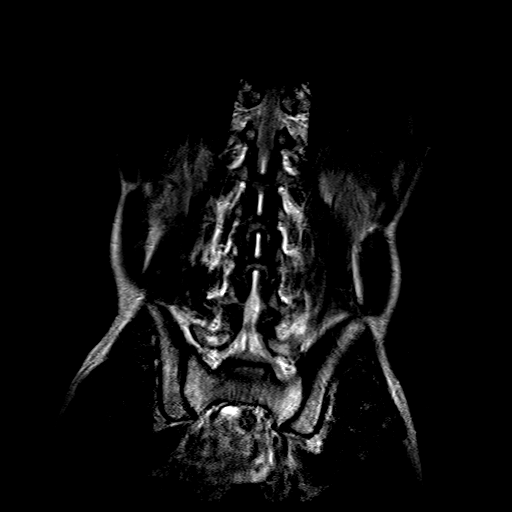
[im 10/17]
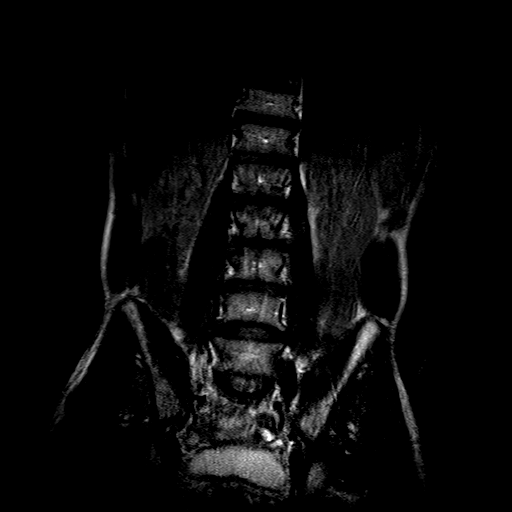
[im 12/17]
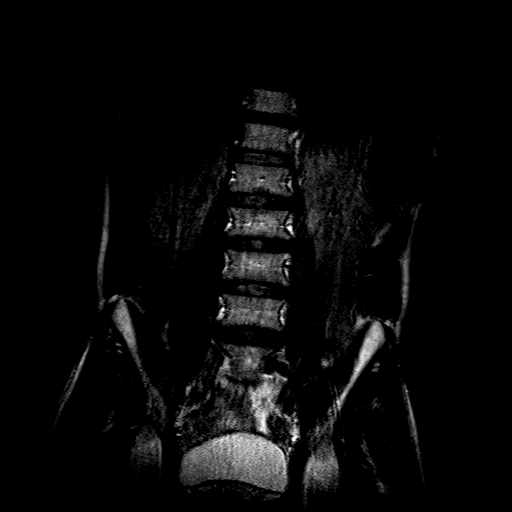
[im 14/17]
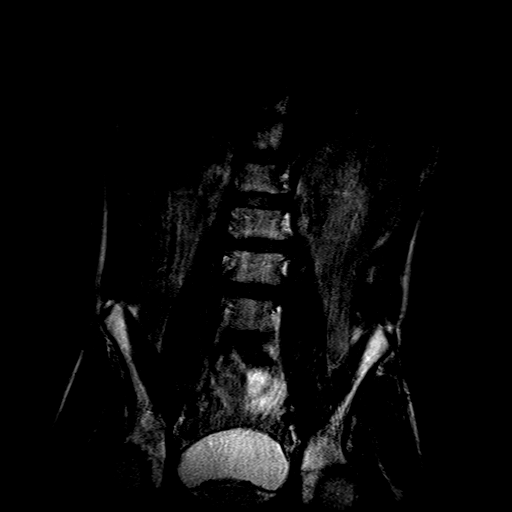
[im 17/17]
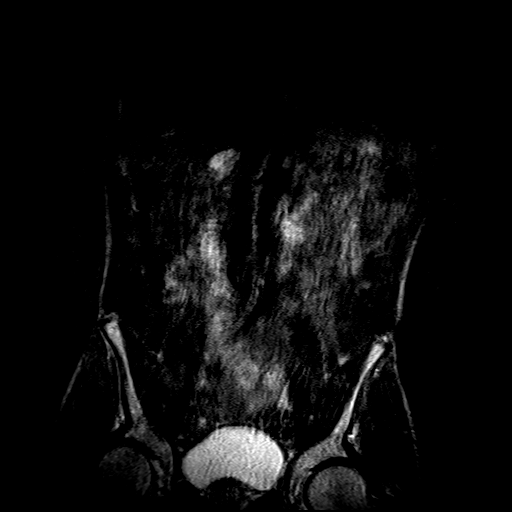

[31 of 48 positions shown; findings below may reference images not displayed]

FINDINGS: Overall quality of the images are severely compromised due to excessive motion artifacts despite repeating all the series.  Evaluation of this examination is in the marginally acceptable range.

No focal bone changes of lumbar vertebrae are seen in the T1 and T2 sagittal images.  No evidence of metastatic disease is noted to the lumbar spine. 

At L1-2 level, no focal disc lesions are seen. 

At L2-3 level, significant bilateral facet arthropathy with degenerative disc disease is causing significant compromise of both lateral recesses and compromise of thecal sac with AP diameter in the midline measuring 7.3 mm.

At L3-4 level, bilateral facet arthropathy is causing significant compromise of both lateral recesses and compromise of thecal sac with AP diameter in the midline measuring 6.3 mm. 

At L4-5 level, severe degenerative disc changes are noted with asymmetrically bulging annulus to the left along with facet arthropathy causing severe compromise of left neural foramen and lateral recess and significant compromise of right neural foramen.  Spinal stenosis with AP diameter of thecal sac in the midline measuring 7.3 mm. 

At L5-S1 level, mild biforaminal narrowing is noted due to facet arthropathy. 

No gross focal abnormalities of the paravertebral soft tissues are seen.
IMPRESSION: 1. Severely motion compromised examination as mentioned above.

2. No evidence of metastatic disease to the lumbar spine is suggested in this examination.

3.  At L4-5 level, severe degenerative disc changes are noted with asymmetrically bulging annulus to the left along with facet arthropathy causing severe compromise of left neural foramen and lateral recess and significant compromise of right neural foramen.  Spinal stenosis with AP diameter of thecal sac in the midline measuring 7.3 mm. 

4. Pathology at other disc levels are described above in detail.
